# Patient Record
Sex: Female | Born: 1960 | Race: White | Hispanic: No | Marital: Married | Smoking: Current some day smoker
Health system: Southern US, Community
[De-identification: ages and names within clinical notes are randomized; demographics above are authoritative.]

## PROBLEM LIST (undated history)

## (undated) DIAGNOSIS — R5383 Other fatigue: Secondary | ICD-10-CM

## (undated) DIAGNOSIS — D649 Anemia, unspecified: Secondary | ICD-10-CM

## (undated) DIAGNOSIS — T23409A Corrosion of unspecified degree of unspecified hand, unspecified site, initial encounter: Secondary | ICD-10-CM

## (undated) DIAGNOSIS — F329 Major depressive disorder, single episode, unspecified: Secondary | ICD-10-CM

## (undated) DIAGNOSIS — E785 Hyperlipidemia, unspecified: Secondary | ICD-10-CM

## (undated) DIAGNOSIS — F32A Depression, unspecified: Secondary | ICD-10-CM

## (undated) DIAGNOSIS — F419 Anxiety disorder, unspecified: Secondary | ICD-10-CM

## (undated) DIAGNOSIS — J45909 Unspecified asthma, uncomplicated: Secondary | ICD-10-CM

## (undated) DIAGNOSIS — C189 Malignant neoplasm of colon, unspecified: Secondary | ICD-10-CM

## (undated) DIAGNOSIS — K635 Polyp of colon: Secondary | ICD-10-CM

## (undated) HISTORY — DX: Anemia, unspecified: D64.9

## (undated) HISTORY — DX: Major depressive disorder, single episode, unspecified: F32.9

## (undated) HISTORY — DX: Depression, unspecified: F32.A

## (undated) HISTORY — DX: Malignant neoplasm of colon, unspecified: C18.9

## (undated) HISTORY — PX: TONSILLECTOMY: SUR1361

---

## 2013-12-12 DIAGNOSIS — T23409A Corrosion of unspecified degree of unspecified hand, unspecified site, initial encounter: Secondary | ICD-10-CM

## 2013-12-12 HISTORY — DX: Corrosion of unspecified degree of unspecified hand, unspecified site, initial encounter: T23.409A

## 2015-12-13 DIAGNOSIS — K635 Polyp of colon: Secondary | ICD-10-CM

## 2015-12-13 DIAGNOSIS — C189 Malignant neoplasm of colon, unspecified: Secondary | ICD-10-CM

## 2015-12-13 HISTORY — PX: OTHER SURGICAL HISTORY: SHX169

## 2015-12-13 HISTORY — DX: Malignant neoplasm of colon, unspecified: C18.9

## 2015-12-13 HISTORY — DX: Polyp of colon: K63.5

## 2016-08-31 ENCOUNTER — Ambulatory Visit: Payer: BLUE CROSS/BLUE SHIELD | Attending: Gynecologic Oncology | Admitting: Gynecologic Oncology

## 2016-08-31 ENCOUNTER — Encounter: Payer: Self-pay | Admitting: Gynecologic Oncology

## 2016-08-31 VITALS — BP 114/93 | HR 73 | Temp 98.2°F | Resp 18 | Ht 62.0 in | Wt 147.7 lb

## 2016-08-31 DIAGNOSIS — F17210 Nicotine dependence, cigarettes, uncomplicated: Secondary | ICD-10-CM | POA: Diagnosis not present

## 2016-08-31 DIAGNOSIS — D259 Leiomyoma of uterus, unspecified: Secondary | ICD-10-CM | POA: Insufficient documentation

## 2016-08-31 DIAGNOSIS — D649 Anemia, unspecified: Secondary | ICD-10-CM | POA: Diagnosis not present

## 2016-08-31 DIAGNOSIS — Z720 Tobacco use: Secondary | ICD-10-CM | POA: Insufficient documentation

## 2016-08-31 DIAGNOSIS — F329 Major depressive disorder, single episode, unspecified: Secondary | ICD-10-CM | POA: Diagnosis not present

## 2016-08-31 DIAGNOSIS — C541 Malignant neoplasm of endometrium: Secondary | ICD-10-CM

## 2016-08-31 DIAGNOSIS — Z85038 Personal history of other malignant neoplasm of large intestine: Secondary | ICD-10-CM | POA: Diagnosis not present

## 2016-08-31 NOTE — Patient Instructions (Signed)
Preparing for your Surgery  Plan for surgery on September 22, 2016 at [facility] with Dr. Everitt Amber.  You will be scheduled for a robotic assisted total hysterectomy, bilateral salpingo-oophorectomy, sentinel lymph node biopsy.  We will also place a referral for you to meet with the Genetics Counselor at the [facility] after your surgery.  Pre-operative Testing -You will receive a phone call from presurgical testing at [facility] to arrange for a pre-operative testing appointment before your surgery.  This appointment normally occurs one to two weeks before your scheduled surgery.   -Bring your insurance card, copy of an advanced directive if applicable, medication list  -At that visit, you will be asked to sign a consent for a possible blood transfusion in case a transfusion becomes necessary during surgery.  The need for a blood transfusion is rare but having consent is a necessary part of your care.     -You should not be taking blood thinners or aspirin at least ten days prior to surgery unless instructed by your surgeon.  Day Before Surgery at [place] will be asked to take in a light diet the day before surgery.  Avoid carbonated beverages.  You will be advised to have nothing to eat or drink after midnight the evening before.     Eat a light diet the day before surgery.  Examples including soups, broths, toast, yogurt, mashed potatoes.  Things to avoid include carbonated beverages (fizzy beverages), raw fruits and raw vegetables, or beans.    If your bowels are filled with gas, your surgeon will have difficulty visualizing your pelvic organs which increases your surgical risks.  Your role in recovery Your role is to become active as soon as directed by your doctor, while still giving yourself time to heal.  Rest when you feel tired. You will be asked to do the following in order to speed your recovery:  - Cough and breathe deeply. This helps  toclear and expand your lungs and can prevent pneumonia. You may be given a spirometer to practice deep breathing. A staff member will show you how to use the spirometer. - Do mild physical activity. Walking or moving your legs help your circulation and body functions return to normal. A staff member will help you when you try to walk and will provide you with simple exercises. Do not try to get up or walk alone the first time. - Actively manage your pain. Managing your pain lets you move in comfort. We will ask you to rate your pain on a scale of zero to 10. It is your responsibility to tell your doctor or nurse where and how much you hurt so your pain can be treated.  Special Considerations -If you are diabetic, you may be placed on insulin after surgery to have closer control over your blood sugars to promote healing and recovery.  This does not mean that you will be discharged on insulin.  If applicable, your oral antidiabetics will be resumed when you are tolerating a solid diet.  -Your final pathology results from surgery should be available by the Friday after surgery and the results will be relayed to you when available.   Blood Transfusion Information WHAT IS A BLOOD TRANSFUSION? A transfusion is the replacement of blood or some of its parts. Blood is made up of multiple cells which provide different functions.  Red blood cells carry oxygen and are used for blood loss replacement.  White blood cells fight against  infection.  Platelets control bleeding.  Plasma helps clot blood.  Other blood products are available for specialized needs, such as hemophilia or other clotting disorders. BEFORE THE TRANSFUSION  Who gives blood for transfusions?   You may be able to donate blood to be used at a later date on yourself (autologous donation).  Relatives can be asked to donate blood. This is generally not any safer than if you have received blood from a stranger. The same precautions are  taken to ensure safety when a relative's blood is donated.  Healthy volunteers who are fully evaluated to make sure their blood is safe. This is blood bank blood. Transfusion therapy is the safest it has ever been in the practice of medicine. Before blood is taken from a donor, a complete history is taken to make sure that person has no history of diseases nor engages in risky social behavior (examples are intravenous drug use or sexual activity with multiple partners). The donor's travel history is screened to minimize risk of transmitting infections, such as malaria. The donated blood is tested for signs of infectious diseases, such as HIV and hepatitis. The blood is then tested to be sure it is compatible with you in order to minimize the chance of a transfusion reaction. If you or a relative donates blood, this is often done in anticipation of surgery and is not appropriate for emergency situations. It takes many days to process the donated blood. RISKS AND COMPLICATIONS Although transfusion therapy is very safe and saves many lives, the main dangers of transfusion include:   Getting an infectious disease.  Developing a transfusion reaction. This is an allergic reaction to something in the blood you were given. Every precaution is taken to prevent this. The decision to have a blood transfusion has been considered carefully by your caregiver before blood is given. Blood is not given unless the benefits outweigh the risks.

## 2016-08-31 NOTE — Addendum Note (Signed)
Addended by: Joylene John D on: 08/31/2016 01:24 PM   Modules accepted: Orders

## 2016-08-31 NOTE — Progress Notes (Signed)
Consult Note: Gyn-Onc  Consult was requested by Dr. Rutha Bouchard for the evaluation of Stephanie Mclean 55 y.o. female  CC:  Chief Complaint  Patient presents with  . Endometrial cancer    New Consultation    Assessment/Plan:  Ms. Stephanie Mclean  is a 62 y.o.  year old with grade 1 endometrioid endometrial cancer. She has a personal history of an early stage colon cancer removed colonoscopically earlier this year.   A detailed discussion was held with the patient and her family with regard to to her endometrial cancer diagnosis. We discussed the standard management options for uterine cancer which includes surgery followed possibly by adjuvant therapy depending on the results of surgery. The options for surgical management include a hysterectomy and removal of the tubes and ovaries possibly with removal of pelvic and para-aortic lymph nodes. A minimally invasive approach including a robotic hysterectomy or laparoscopic hysterectomy have benefits including shorter hospital stay, recovery time and better wound healing. The alternative approach is an open hysterectomy. The patient has been counseled about these surgical options and the risks of surgery in general including infection, bleeding, damage to surrounding structures (including bowel, bladder, ureters, nerves or vessels), and the postoperative risks of PE/ DVT, and lymphedema. I extensively reviewed the additional risks of robotic hysterectomy including possible need for conversion to open laparotomy.  I discussed positioning during surgery of trendelenberg and risks of minor facial swelling and care we take in preoperative positioning.  After counseling and consideration of her options, she desires to proceed with robotic asssisted total hysterectomy, BSO, SLN biopsy.  I discussed that she should attempt to cut back or quit smoking preoperatively to decrease risk of perioperative complications and optimize wound healing.  She will be seen by  anesthesia for preoperative clearance and discussion of postoperative pain management.  She was given the opportunity to ask questions, which were answered to her satisfaction, and she is agreement with the above mentioned plan of care.  She will be referred to genetic counselors to discuss Lynch syndrome testing given her young age at diagnosis (<60), lack of comorbidities, and metachronous colon cancer diagnosis. She otherwise does not have a family history suggestive of lynch syndrome   HPI: Stephanie Mclean is a 3 year old G2P2 who is seen in consultation at the request of Dr Rutha Bouchard for endometrial cancer (grade 1).  The patient has a history of abnormal uterine bleeding for approximately 5 years. She reported having benign endometrial sampling in approximately 2014. She has known uterine fibroids and an ultrasound of the pelvis in December 2016 showed the largest measuring 2.2cm, and an 24mm endometrial thickness.   Endometrial sampling with pipelle on 08/16/16 showed FIGO grade 1 endometrial adenocarcinoma.   In January 2017 she was diagnosed with a preinvasive vs early stage colon cancer that was completely resected colonoscopically.  She has no first or second degree relatives with Lynch-associated malignancies.  Current Meds:  Outpatient Encounter Prescriptions as of 08/31/2016  Medication Sig  . ezetimibe (ZETIA) 10 MG tablet Take 10 mg by mouth daily.  . Fe Fum-FA-B Cmp-C-Zn-Mg-Mn-Cu (FERROCITE PLUS) 106-1 MG TABS Take 1 tablet by mouth 2 (two) times daily.  Marland Kitchen gemfibrozil (LOPID) 600 MG tablet Take 600 mg by mouth 2 (two) times daily before a meal.  . LORazepam (ATIVAN) 0.5 MG tablet Take 0.5 mg by mouth every 8 (eight) hours as needed for anxiety.  Marland Kitchen omeprazole (PRILOSEC) 40 MG capsule Take 40 mg by mouth daily.  . sertraline (  ZOLOFT) 100 MG tablet Take 100 mg by mouth daily.   No facility-administered encounter medications on file as of 08/31/2016.     Allergy: Not on  File  Social Hx:   Social History   Social History  . Marital status: Married    Spouse name: N/A  . Number of children: N/A  . Years of education: N/A   Occupational History  . Not on file.   Social History Main Topics  . Smoking status: Current Some Day Smoker    Packs/day: 0.50    Years: 30.00    Types: Cigarettes  . Smokeless tobacco: Never Used  . Alcohol use Yes     Comment: occ  . Drug use: No  . Sexual activity: Yes   Other Topics Concern  . Not on file   Social History Narrative  . No narrative on file    Past Surgical Hx:  Past Surgical History:  Procedure Laterality Date  . TONSILLECTOMY     at age 39 years old    Past Medical Hx:  Past Medical History:  Diagnosis Date  . Anemia   . Colon cancer ([place]) 12/2015  . Depression     Past Gynecological History:  Abnormal uterine bleeding, SVD x 2 No LMP recorded.  Family Hx:  Family History  Problem Relation Age of Onset  . Diabetes Mother   . Stroke Father   . Cancer Paternal Uncle     Review of Systems:  Constitutional  Feels well,    ENT Normal appearing ears and nares bilaterally Skin/Breast  No rash, sores, jaundice, itching, dryness Cardiovascular  No chest pain, shortness of breath, or edema  Pulmonary  No cough or wheeze.  Gastro Intestinal  No nausea, vomitting, or diarrhoea. No bright red blood per rectum, no abdominal pain, change in bowel movement, or constipation.  Genito Urinary  No frequency, urgency, dysuria, + abnormal bleeding Musculo Skeletal  No myalgia, arthralgia, joint swelling or pain  Neurologic  No weakness, numbness, change in gait,  Psychology  No depression, anxiety, insomnia.   Vitals:  Blood pressure (!) 114/93, pulse 73, temperature 98.2 F (36.8 C), temperature source Oral, resp. rate 18, height 5\' 2"  (1.575 m), weight 147 lb 11.2 oz (67 kg), SpO2 96 %.  Physical Exam: WD in NAD Neck  Supple NROM, without any enlargements.  Lymph Node  Survey No cervical supraclavicular or inguinal adenopathy Cardiovascular  Pulse normal rate, regularity and rhythm. S1 and S2 normal.  Lungs  Clear to auscultation bilateraly, without wheezes/crackles/rhonchi. Good air movement.  Skin  No rash/lesions/breakdown  Psychiatry  Alert and oriented to person, place, and time  Abdomen  Normoactive bowel sounds, abdomen soft, non-tender and nonobese without evidence of hernia.  Back No CVA tenderness Genito Urinary  Vulva/vagina: Normal external female genitalia.  No lesions. No discharge or bleeding.  Bladder/urethra:  No lesions or masses, well supported bladder  Vagina: normal  Cervix: Normal appearing, no lesions.  Uterus:  Slightly bulky mobile, no parametrial involvement or nodularity.  Adnexa: nor masses. Rectal  Deferred.  Extremities  No bilateral cyanosis, clubbing or edema.   Donaciano Eva, MD  08/31/2016, 10:59 AM

## 2016-09-01 NOTE — Progress Notes (Signed)
Coming for pre op- please place orders in epic  thanks

## 2016-09-19 NOTE — Patient Instructions (Addendum)
Stephanie Mclean  09/19/2016   Your procedure is scheduled on: 09-29-16  Report to [facility] Main  Entrance take [facility]  elevators to 3rd floor to  [place] at 530  AM.  Call this number if you have problems the morning of surgery [phone]   Remember: ONLY 1 PERSON MAY GO WITH YOU TO SHORT STAY TO GET  READY MORNING OF [name].  Do not eat food or drink liquids :After Midnight NIGHT BEFORE SURGEYR, LIGHT DIET DAY BEFORE SURGERY SEE INSTRUCTIONS BELOW.   Take these medicines the morning of surgery with A SIP OF WATER: LORAZEPAM (ATIVAN ) IF NEEDED SERTRALINE (ZOLOFT) OMEPRAZOLE (ZOLOFT)                               You may not have any metal on your body including hair pins and              piercings  Do not wear jewelry, make-up, lotions, powders or perfumes, deodorant             Do not wear nail polish.  Do not shave  48 hours prior to surgery.              Men may shave face and neck.   Do not bring valuables to the hospital. [place].  Contacts, dentures or bridgework may not be worn into surgery.  Leave suitcase in the car. After surgery it may be brought to your room.                 Please read over the following fact sheets you were given: _____________________________________________________________________             Eat a light diet the day before surgery.  Examples including soups, broths, toast, yogurt, mashed potatoes.  Things to avoid include carbonated beverages (fizzy beverages), raw fruits and raw vegetables, or beans.   If your bowels are filled with gas, your surgeon will have difficulty visualizing your pelvic organs which increases your surgical risks.Stephanie Mclean - Preparing for Surgery Before surgery, you can play an important role.  Because skin is not sterile, your skin needs to be as free of germs as possible.  You can reduce the number of germs on your skin by washing  with CHG (chlorahexidine gluconate) soap before surgery.  CHG is an antiseptic cleaner which kills germs and bonds with the skin to continue killing germs even after washing. Please DO NOT use if you have an allergy to CHG or antibacterial soaps.  If your skin becomes reddened/irritated stop using the CHG and inform your nurse when you arrive at Short Stay. Do not shave (including legs and underarms) for at least 48 hours prior to the first CHG shower.  You may shave your face/neck. Please follow these instructions carefully:  1.  Shower with CHG Soap the night before surgery and the  morning of Surgery.  2.  If you choose to wash your hair, wash your hair first as usual with your  normal  shampoo.  3.  After you shampoo, rinse your hair and body thoroughly to remove the  shampoo.  4.  Use CHG as you would any other liquid soap.  You can apply chg directly  to the skin and wash                       Gently with a scrungie or clean washcloth.  5.  Apply the CHG Soap to your body ONLY FROM THE NECK DOWN.   Do not use on face/ open                           Wound or open sores. Avoid contact with eyes, ears mouth and genitals (private parts).                       Wash face,  Genitals (private parts) with your normal soap.             6.  Wash thoroughly, paying special attention to the area where your surgery  will be performed.  7.  Thoroughly rinse your body with warm water from the neck down.  8.  DO NOT shower/wash with your normal soap after using and rinsing off  the CHG Soap.                9.  Pat yourself dry with a clean towel.            10.  Wear clean pajamas.            11.  Place clean sheets on your bed the night of your first shower and do not  sleep with pets. Day of Surgery : Do not apply any lotions/deodorants the morning of surgery.  Please wear clean clothes to the hospital/surgery center.  FAILURE TO FOLLOW THESE INSTRUCTIONS MAY RESULT IN THE  CANCELLATION OF YOUR SURGERY PATIENT SIGNATURE_________________________________  NURSE SIGNATURE__________________________________  ________________________________________________________________________   Stephanie Mclean  An incentive spirometer is a tool that can help keep your lungs clear and active. This tool measures how well you are filling your lungs with each breath. Taking long deep breaths may help reverse or decrease the chance of developing breathing (pulmonary) problems (especially infection) following:  A long period of time when you are unable to move or be active. BEFORE THE PROCEDURE   If the spirometer includes an indicator to show your best effort, your nurse or respiratory therapist will set it to a desired goal.  If possible, sit up straight or lean slightly forward. Try not to slouch.  Hold the incentive spirometer in an upright position. INSTRUCTIONS FOR USE  1. Sit on the edge of your bed if possible, or sit up as far as you can in bed or on a chair. 2. Hold the incentive spirometer in an upright position. 3. Breathe out normally. 4. Place the mouthpiece in your mouth and seal your lips tightly around it. 5. Breathe in slowly and as deeply as possible, raising the piston or the ball toward the top of the column. 6. Hold your breath for 3-5 seconds or for as long as possible. Allow the piston or ball to fall to the bottom of the column. 7. Remove the mouthpiece from your mouth and breathe out normally. 8. Rest for a few seconds and repeat Steps 1 through 7 at least 10 times every 1-2 hours when you are awake. Take your time and take a few normal breaths between deep breaths. 9. The spirometer may include an indicator to show  your best effort. Use the indicator as a goal to work toward during each repetition. 10. After each set of 10 deep breaths, practice coughing to be sure your lungs are clear. If you have an incision (the cut made at the time of surgery),  support your incision when coughing by placing a pillow or rolled up towels firmly against it. Once you are able to get out of bed, walk around indoors and cough well. You may stop using the incentive spirometer when instructed by your caregiver.  RISKS AND COMPLICATIONS  Take your time so you do not get dizzy or light-headed.  If you are in pain, you may need to take or ask for pain medication before doing incentive spirometry. It is harder to take a deep breath if you are having pain. AFTER USE  Rest and breathe slowly and easily.  It can be helpful to keep track of a log of your progress. Your caregiver can provide you with a simple table to help with this. If you are using the spirometer at home, follow these instructions: [place] IF:   You are having difficultly using the spirometer.  You have trouble using the spirometer as often as instructed.  Your pain medication is not giving enough relief while using the spirometer.  You develop fever of 100.5 F (38.1 C) or higher. SEEK IMMEDIATE MEDICAL CARE IF:   You cough up bloody sputum that had not been present before.  You develop fever of 102 F (38.9 C) or greater.  You develop worsening pain at or near the incision site. MAKE SURE YOU:   Understand these instructions.  Will watch your condition.  Will get help right away if you are not doing well or get worse. Document Released: 04/10/2007 Document Revised: 02/20/2012 Document Reviewed: 06/11/2007 Exit[employer] Patient Information 2014 [employer], [place].   ________________________________________________________________________  WHAT IS A BLOOD TRANSFUSION? Blood Transfusion Information  A transfusion is the replacement of blood or some of its parts. Blood is made up of multiple cells which provide different functions.  Red blood cells carry oxygen and are used for blood loss replacement.  White blood cells fight against infection.  Platelets control  bleeding.  Plasma helps clot blood.  Other blood products are available for specialized needs, such as hemophilia or other clotting disorders. BEFORE THE TRANSFUSION  Who gives blood for transfusions?   Healthy volunteers who are fully evaluated to make sure their blood is safe. This is blood bank blood. Transfusion therapy is the safest it has ever been in the practice of medicine. Before blood is taken from a donor, a complete history is taken to make sure that person has no history of diseases nor engages in risky social behavior (examples are intravenous drug use or sexual activity with multiple partners). The donor's travel history is screened to minimize risk of transmitting infections, such as malaria. The donated blood is tested for signs of infectious diseases, such as HIV and hepatitis. The blood is then tested to be sure it is compatible with you in order to minimize the chance of a transfusion reaction. If you or a relative donates blood, this is often done in anticipation of surgery and is not appropriate for emergency situations. It takes many days to process the donated blood. RISKS AND COMPLICATIONS Although transfusion therapy is very safe and saves many lives, the main dangers of transfusion include:   Getting an infectious disease.  Developing a transfusion reaction. This is an allergic reaction to  something in the blood you were given. Every precaution is taken to prevent this. The decision to have a blood transfusion has been considered carefully by your caregiver before blood is given. Blood is not given unless the benefits outweigh the risks. AFTER THE TRANSFUSION  Right after receiving a blood transfusion, you will usually feel much better and more energetic. This is especially true if your red blood cells have gotten low (anemic). The transfusion raises the level of the red blood cells which carry oxygen, and this usually causes an energy increase.  The nurse  administering the transfusion will monitor you carefully for complications. HOME CARE INSTRUCTIONS  No special instructions are needed after a transfusion. You may find your energy is better. Speak with your caregiver about any limitations on activity for underlying diseases you may have. SEEK MEDICAL CARE IF:   Your condition is not improving after your transfusion.  You develop redness or irritation at the intravenous (IV) site. SEEK IMMEDIATE MEDICAL CARE IF:  Any of the following symptoms occur over the next 12 hours:  Shaking chills.  You have a temperature by mouth above 102 F (38.9 C), not controlled by medicine.  Chest, back, or muscle pain.  People around you feel you are not acting correctly or are confused.  Shortness of breath or difficulty breathing.  Dizziness and fainting.  You get a rash or develop hives.  You have a decrease in urine output.  Your urine turns a dark color or changes to pink, red, or brown. Any of the following symptoms occur over the next 10 days:  You have a temperature by mouth above 102 F (38.9 C), not controlled by medicine.  Shortness of breath.  Weakness after normal activity.  The white part of the eye turns yellow (jaundice).  You have a decrease in the amount of urine or are urinating less often.  Your urine turns a dark color or changes to pink, red, or brown. Document Released: 11/25/2000 Document Revised: 02/20/2012 Document Reviewed: 07/14/2008 [facility] Patient Information 2014 [place], [place].  _______________________________________________________________________

## 2016-09-20 ENCOUNTER — Encounter (HOSPITAL_COMMUNITY): Payer: Self-pay | Admitting: *Deleted

## 2016-09-20 ENCOUNTER — Ambulatory Visit (HOSPITAL_COMMUNITY)
Admission: RE | Admit: 2016-09-20 | Discharge: 2016-09-20 | Disposition: A | Discharge status: Home / Self Care | Payer: BLUE CROSS/BLUE SHIELD | Source: Ambulatory Visit | Attending: Gynecologic Oncology | Admitting: Gynecologic Oncology

## 2016-09-20 ENCOUNTER — Encounter (HOSPITAL_COMMUNITY)
Admission: RE | Admit: 2016-09-20 | Discharge: 2016-09-20 | Disposition: A | Discharge status: Home / Self Care | Payer: BLUE CROSS/BLUE SHIELD | Source: Ambulatory Visit | Attending: Gynecologic Oncology | Admitting: Gynecologic Oncology

## 2016-09-20 DIAGNOSIS — Z01812 Encounter for preprocedural laboratory examination: Secondary | ICD-10-CM | POA: Diagnosis not present

## 2016-09-20 DIAGNOSIS — Z01810 Encounter for preprocedural cardiovascular examination: Secondary | ICD-10-CM | POA: Insufficient documentation

## 2016-09-20 DIAGNOSIS — C541 Malignant neoplasm of endometrium: Secondary | ICD-10-CM

## 2016-09-20 DIAGNOSIS — J45909 Unspecified asthma, uncomplicated: Secondary | ICD-10-CM

## 2016-09-20 HISTORY — DX: Unspecified asthma, uncomplicated: J45.909

## 2016-09-20 HISTORY — DX: Polyp of colon: K63.5

## 2016-09-20 HISTORY — DX: Anxiety disorder, unspecified: F41.9

## 2016-09-20 HISTORY — DX: Other fatigue: R53.83

## 2016-09-20 HISTORY — DX: Hyperlipidemia, unspecified: E78.5

## 2016-09-20 HISTORY — DX: Corrosion of unspecified degree of unspecified hand, unspecified site, initial encounter: T23.409A

## 2016-09-20 LAB — CBC WITH DIFFERENTIAL/PLATELET
BASOS PCT: 1 %
Basophils Absolute: 0 10*3/uL (ref 0.0–0.1)
EOS ABS: 0.1 10*3/uL (ref 0.0–0.7)
EOS PCT: 2 %
HCT: 43.6 % (ref 36.0–46.0)
HEMOGLOBIN: 15.1 g/dL — AB (ref 12.0–15.0)
LYMPHS ABS: 1.1 10*3/uL (ref 0.7–4.0)
Lymphocytes Relative: 30 %
MCH: 32.9 pg (ref 26.0–34.0)
MCHC: 34.6 g/dL (ref 30.0–36.0)
MCV: 95 fL (ref 78.0–100.0)
MONO ABS: 0.4 10*3/uL (ref 0.1–1.0)
MONOS PCT: 10 %
NEUTROS PCT: 57 %
Neutro Abs: 1.9 10*3/uL (ref 1.7–7.7)
Platelets: 258 10*3/uL (ref 150–400)
RBC: 4.59 MIL/uL (ref 3.87–5.11)
RDW: 12.4 % (ref 11.5–15.5)
WBC: 3.5 10*3/uL — ABNORMAL LOW (ref 4.0–10.5)

## 2016-09-20 LAB — ABO/RH: ABO/RH(D): O POS

## 2016-09-20 LAB — COMPREHENSIVE METABOLIC PANEL
ALK PHOS: 47 U/L (ref 38–126)
ALT: 10 U/L — ABNORMAL LOW (ref 14–54)
ANION GAP: 7 (ref 5–15)
AST: 17 U/L (ref 15–41)
Albumin: 4.8 g/dL (ref 3.5–5.0)
BILIRUBIN TOTAL: 0.8 mg/dL (ref 0.3–1.2)
BUN: 19 mg/dL (ref 6–20)
CALCIUM: 9.5 mg/dL (ref 8.9–10.3)
CO2: 24 mmol/L (ref 22–32)
Chloride: 108 mmol/L (ref 101–111)
Creatinine, Ser: 0.51 mg/dL (ref 0.44–1.00)
GFR calc non Af Amer: >60 mL/min (ref >60)
Glucose, Bld: 89 mg/dL (ref 65–99)
POTASSIUM: 4.3 mmol/L (ref 3.5–5.1)
SODIUM: 139 mmol/L (ref 135–145)
TOTAL PROTEIN: 7.6 g/dL (ref 6.5–8.1)

## 2016-09-20 LAB — URINALYSIS, ROUTINE W REFLEX MICROSCOPIC
Bilirubin Urine: NEGATIVE
Glucose, UA: NEGATIVE mg/dL
Hgb urine dipstick: NEGATIVE
KETONES UR: NEGATIVE mg/dL
LEUKOCYTES UA: NEGATIVE
NITRITE: NEGATIVE
PROTEIN: NEGATIVE mg/dL
Specific Gravity, Urine: 1.022 (ref 1.005–1.030)
pH: 6 (ref 5.0–8.0)

## 2016-09-20 NOTE — Progress Notes (Signed)
CHEST XRAY [facility] 12-24-15 ON CHART

## 2016-09-20 NOTE — Progress Notes (Signed)
Spoke with dr germeroth enesthesia and no serum pregnancy test required for patient and patient spouse has had vasectomy

## 2016-09-29 ENCOUNTER — Encounter (HOSPITAL_COMMUNITY)
Admission: RE | Disposition: A | Discharge status: Home / Self Care | Payer: Self-pay | Source: Ambulatory Visit | Attending: Gynecologic Oncology

## 2016-09-29 ENCOUNTER — Ambulatory Visit (HOSPITAL_COMMUNITY): Payer: BLUE CROSS/BLUE SHIELD | Admitting: Anesthesiology

## 2016-09-29 ENCOUNTER — Encounter (HOSPITAL_COMMUNITY): Payer: Self-pay | Admitting: *Deleted

## 2016-09-29 ENCOUNTER — Ambulatory Visit (HOSPITAL_COMMUNITY)
Admission: RE | Admit: 2016-09-29 | Discharge: 2016-09-30 | Disposition: A | Discharge status: Home / Self Care | Payer: BLUE CROSS/BLUE SHIELD | Source: Ambulatory Visit | Attending: Gynecologic Oncology | Admitting: Gynecologic Oncology

## 2016-09-29 DIAGNOSIS — F17210 Nicotine dependence, cigarettes, uncomplicated: Secondary | ICD-10-CM | POA: Diagnosis not present

## 2016-09-29 DIAGNOSIS — C541 Malignant neoplasm of endometrium: Secondary | ICD-10-CM | POA: Diagnosis not present

## 2016-09-29 DIAGNOSIS — D251 Intramural leiomyoma of uterus: Secondary | ICD-10-CM | POA: Insufficient documentation

## 2016-09-29 HISTORY — PX: ROBOTIC ASSISTED TOTAL HYSTERECTOMY WITH BILATERAL SALPINGO OOPHERECTOMY: SHX6086

## 2016-09-29 HISTORY — PX: LYMPH NODE BIOPSY: SHX201

## 2016-09-29 LAB — TYPE AND SCREEN
ABO/RH(D): O POS
Antibody Screen: NEGATIVE

## 2016-09-29 SURGERY — HYSTERECTOMY, TOTAL, ROBOT-ASSISTED, LAPAROSCOPIC, WITH BILATERAL SALPINGO-OOPHORECTOMY
Anesthesia: General

## 2016-09-29 MED ORDER — GABAPENTIN 300 MG PO CAPS
600.0000 mg | ORAL_CAPSULE | Freq: Every day | ORAL | Status: AC
  Administered 2016-09-29: 600 mg via ORAL
  Filled 2016-09-29: qty 2

## 2016-09-29 MED ORDER — HYDROMORPHONE HCL 2 MG/ML IJ SOLN
INTRAMUSCULAR | Status: AC
  Filled 2016-09-29: qty 1

## 2016-09-29 MED ORDER — DEXMEDETOMIDINE HCL IN NACL 400 MCG/100ML IV SOLN
INTRAVENOUS | Status: DC | PRN
  Administered 2016-09-29: 0.7 ug/kg/h via INTRAVENOUS

## 2016-09-29 MED ORDER — ROCURONIUM BROMIDE 10 MG/ML (PF) SYRINGE
PREFILLED_SYRINGE | INTRAVENOUS | Status: AC
  Filled 2016-09-29: qty 10

## 2016-09-29 MED ORDER — DEXAMETHASONE SODIUM PHOSPHATE 10 MG/ML IJ SOLN
INTRAMUSCULAR | Status: AC
  Filled 2016-09-29: qty 1

## 2016-09-29 MED ORDER — CEFAZOLIN SODIUM-DEXTROSE 2-4 GM/100ML-% IV SOLN
INTRAVENOUS | Status: AC
  Filled 2016-09-29: qty 100

## 2016-09-29 MED ORDER — KETOROLAC TROMETHAMINE 15 MG/ML IJ SOLN
15.0000 mg | Freq: Four times a day (QID) | INTRAMUSCULAR | Status: DC
  Administered 2016-09-29 – 2016-09-30 (×3): 15 mg via INTRAVENOUS
  Filled 2016-09-29 (×3): qty 1

## 2016-09-29 MED ORDER — ROCURONIUM BROMIDE 100 MG/10ML IV SOLN
INTRAVENOUS | Status: DC | PRN
  Administered 2016-09-29 (×4): 10 mg via INTRAVENOUS
  Administered 2016-09-29: 50 mg via INTRAVENOUS
  Administered 2016-09-29: 10 mg via INTRAVENOUS

## 2016-09-29 MED ORDER — PROMETHAZINE HCL 25 MG/ML IJ SOLN: 6.2500 mg | INTRAMUSCULAR | Status: DC | PRN

## 2016-09-29 MED ORDER — LACTATED RINGERS IV SOLN
INTRAVENOUS | Status: DC | PRN
  Administered 2016-09-29 (×2): via INTRAVENOUS

## 2016-09-29 MED ORDER — HYDROMORPHONE HCL 1 MG/ML IJ SOLN
0.2500 mg | INTRAMUSCULAR | Status: DC | PRN
  Administered 2016-09-29: 0.5 mg via INTRAVENOUS

## 2016-09-29 MED ORDER — OXYCODONE-ACETAMINOPHEN 5-325 MG PO TABS
1.0000 | ORAL_TABLET | ORAL | Status: DC | PRN
  Administered 2016-09-29 – 2016-09-30 (×2): 2 via ORAL
  Filled 2016-09-29 (×2): qty 2

## 2016-09-29 MED ORDER — STERILE WATER FOR IRRIGATION IR SOLN
Status: DC | PRN
  Administered 2016-09-29: 1000 mL

## 2016-09-29 MED ORDER — LORAZEPAM 0.5 MG PO TABS: 0.5000 mg | ORAL_TABLET | Freq: Three times a day (TID) | ORAL | Status: DC | PRN

## 2016-09-29 MED ORDER — SUGAMMADEX SODIUM 200 MG/2ML IV SOLN
INTRAVENOUS | Status: AC
  Filled 2016-09-29: qty 2

## 2016-09-29 MED ORDER — PANTOPRAZOLE SODIUM 40 MG PO TBEC
80.0000 mg | DELAYED_RELEASE_TABLET | Freq: Every day | ORAL | Status: DC
  Administered 2016-09-30: 80 mg via ORAL
  Filled 2016-09-29: qty 2

## 2016-09-29 MED ORDER — ONDANSETRON HCL 4 MG/2ML IJ SOLN: 4.0000 mg | Freq: Four times a day (QID) | INTRAMUSCULAR | Status: DC | PRN

## 2016-09-29 MED ORDER — STERILE WATER FOR INJECTION IJ SOLN
INTRAMUSCULAR | Status: AC
  Filled 2016-09-29: qty 10

## 2016-09-29 MED ORDER — LACTATED RINGERS IR SOLN
Status: DC | PRN
  Administered 2016-09-29: 1000 mL

## 2016-09-29 MED ORDER — PROPOFOL 10 MG/ML IV BOLUS
INTRAVENOUS | Status: AC
  Filled 2016-09-29: qty 40

## 2016-09-29 MED ORDER — EZETIMIBE 10 MG PO TABS: 10.0000 mg | ORAL_TABLET | Freq: Every day | ORAL | Status: DC

## 2016-09-29 MED ORDER — HYDROMORPHONE HCL 1 MG/ML IJ SOLN
0.2000 mg | INTRAMUSCULAR | Status: DC | PRN
  Administered 2016-09-29: 0.5 mg via INTRAVENOUS
  Filled 2016-09-29: qty 1

## 2016-09-29 MED ORDER — LIDOCAINE HCL (CARDIAC) 20 MG/ML IV SOLN
INTRAVENOUS | Status: DC | PRN
  Administered 2016-09-29: 50 mg via INTRAVENOUS

## 2016-09-29 MED ORDER — DEXMEDETOMIDINE HCL IN NACL 200 MCG/50ML IV SOLN
INTRAVENOUS | Status: AC
  Filled 2016-09-29: qty 50

## 2016-09-29 MED ORDER — CEFAZOLIN SODIUM-DEXTROSE 2-4 GM/100ML-% IV SOLN
2.0000 g | INTRAVENOUS | Status: AC
  Administered 2016-09-29: 2 g via INTRAVENOUS

## 2016-09-29 MED ORDER — HYDROMORPHONE HCL 1 MG/ML IJ SOLN
INTRAMUSCULAR | Status: DC | PRN
  Administered 2016-09-29 (×4): 0.5 mg via INTRAVENOUS

## 2016-09-29 MED ORDER — ONDANSETRON HCL 4 MG/2ML IJ SOLN
INTRAMUSCULAR | Status: AC
  Filled 2016-09-29: qty 2

## 2016-09-29 MED ORDER — PROPOFOL 10 MG/ML IV BOLUS
INTRAVENOUS | Status: DC | PRN
  Administered 2016-09-29: 170 mg via INTRAVENOUS

## 2016-09-29 MED ORDER — ROCURONIUM BROMIDE 10 MG/ML (PF) SYRINGE
PREFILLED_SYRINGE | INTRAVENOUS | Status: AC
Start: 2016-09-29 — End: 2016-09-29
  Filled 2016-09-29: qty 10

## 2016-09-29 MED ORDER — ONDANSETRON HCL 4 MG PO TABS: 4.0000 mg | ORAL_TABLET | Freq: Four times a day (QID) | ORAL | Status: DC | PRN

## 2016-09-29 MED ORDER — ONDANSETRON HCL 4 MG/2ML IJ SOLN
INTRAMUSCULAR | Status: DC | PRN
  Administered 2016-09-29: 4 mg via INTRAVENOUS

## 2016-09-29 MED ORDER — GEMFIBROZIL 600 MG PO TABS
600.0000 mg | ORAL_TABLET | Freq: Two times a day (BID) | ORAL | Status: DC
  Administered 2016-09-29 – 2016-09-30 (×2): 600 mg via ORAL
  Filled 2016-09-29 (×2): qty 1

## 2016-09-29 MED ORDER — ENOXAPARIN SODIUM 40 MG/0.4ML ~~LOC~~ SOLN
40.0000 mg | SUBCUTANEOUS | Status: AC
  Administered 2016-09-29: 40 mg via SUBCUTANEOUS
  Filled 2016-09-29: qty 0.4

## 2016-09-29 MED ORDER — EPHEDRINE SULFATE 50 MG/ML IJ SOLN
INTRAMUSCULAR | Status: DC | PRN
  Administered 2016-09-29: 5 mg via INTRAVENOUS
  Administered 2016-09-29: 10 mg via INTRAVENOUS
  Administered 2016-09-29: 5 mg via INTRAVENOUS

## 2016-09-29 MED ORDER — SUGAMMADEX SODIUM 200 MG/2ML IV SOLN
INTRAVENOUS | Status: DC | PRN
  Administered 2016-09-29: 200 mg via INTRAVENOUS

## 2016-09-29 MED ORDER — LIDOCAINE 2% (20 MG/ML) 5 ML SYRINGE
INTRAMUSCULAR | Status: AC
  Filled 2016-09-29: qty 5

## 2016-09-29 MED ORDER — KCL IN DEXTROSE-NACL 20-5-0.45 MEQ/L-%-% IV SOLN
INTRAVENOUS | Status: DC
  Administered 2016-09-29: 16:00:00 via INTRAVENOUS
  Filled 2016-09-29 (×2): qty 1000

## 2016-09-29 MED ORDER — HYDROMORPHONE HCL 1 MG/ML IJ SOLN
INTRAMUSCULAR | Status: AC
  Filled 2016-09-29: qty 1

## 2016-09-29 MED ORDER — MIDAZOLAM HCL 5 MG/5ML IJ SOLN
INTRAMUSCULAR | Status: DC | PRN
  Administered 2016-09-29: 2 mg via INTRAVENOUS

## 2016-09-29 MED ORDER — FENTANYL CITRATE (PF) 100 MCG/2ML IJ SOLN
INTRAMUSCULAR | Status: DC | PRN
  Administered 2016-09-29: 50 ug via INTRAVENOUS
  Administered 2016-09-29: 100 ug via INTRAVENOUS
  Administered 2016-09-29 (×2): 50 ug via INTRAVENOUS

## 2016-09-29 MED ORDER — GLYCOPYRROLATE 0.2 MG/ML IV SOSY
PREFILLED_SYRINGE | INTRAVENOUS | Status: AC
  Filled 2016-09-29: qty 3

## 2016-09-29 MED ORDER — SODIUM CHLORIDE 0.9 % IJ SOLN
INTRAMUSCULAR | Status: AC
  Filled 2016-09-29: qty 10

## 2016-09-29 MED ORDER — MIDAZOLAM HCL 2 MG/2ML IJ SOLN
INTRAMUSCULAR | Status: AC
  Filled 2016-09-29: qty 2

## 2016-09-29 MED ORDER — FENTANYL CITRATE (PF) 250 MCG/5ML IJ SOLN
INTRAMUSCULAR | Status: AC
  Filled 2016-09-29: qty 5

## 2016-09-29 MED ORDER — DEXAMETHASONE SODIUM PHOSPHATE 10 MG/ML IJ SOLN
INTRAMUSCULAR | Status: DC | PRN
  Administered 2016-09-29: 10 mg via INTRAVENOUS

## 2016-09-29 MED ORDER — SERTRALINE HCL 50 MG PO TABS
100.0000 mg | ORAL_TABLET | Freq: Every day | ORAL | Status: DC
  Administered 2016-09-30: 100 mg via ORAL
  Filled 2016-09-29: qty 2

## 2016-09-29 MED ORDER — ENOXAPARIN SODIUM 40 MG/0.4ML ~~LOC~~ SOLN
40.0000 mg | SUBCUTANEOUS | Status: DC
  Administered 2016-09-30: 40 mg via SUBCUTANEOUS
  Filled 2016-09-29: qty 0.4

## 2016-09-29 MED ORDER — GLYCOPYRROLATE 0.2 MG/ML IV SOSY
PREFILLED_SYRINGE | INTRAVENOUS | Status: DC | PRN
  Administered 2016-09-29: .2 mg via INTRAVENOUS

## 2016-09-29 MED ORDER — IBUPROFEN 800 MG PO TABS: 800.0000 mg | ORAL_TABLET | Freq: Three times a day (TID) | ORAL | Status: DC | PRN

## 2016-09-29 SURGICAL SUPPLY — 56 items
APPLICATOR SURGIFLO ENDO (HEMOSTASIS) IMPLANT
BAG LAPAROSCOPIC 12 15 PORT 16 (BASKET) IMPLANT
BAG RETRIEVAL 12/15 (BASKET)
CHLORAPREP W/TINT 26ML (MISCELLANEOUS) ×2 IMPLANT
COVER SURGICAL LIGHT HANDLE (MISCELLANEOUS) ×2 IMPLANT
COVER TIP SHEARS 8 DVNC (MISCELLANEOUS) ×1 IMPLANT
COVER TIP SHEARS 8MM DA VINCI (MISCELLANEOUS) ×1
DERMABOND ADVANCED (GAUZE/BANDAGES/DRESSINGS) ×1
DERMABOND ADVANCED .7 DNX12 (GAUZE/BANDAGES/DRESSINGS) ×1 IMPLANT
DRAPE ARM DVNC X/XI (DISPOSABLE) ×4 IMPLANT
DRAPE COLUMN DVNC XI (DISPOSABLE) ×1 IMPLANT
DRAPE DA VINCI XI ARM (DISPOSABLE) ×4
DRAPE DA VINCI XI COLUMN (DISPOSABLE) ×1
DRAPE SHEET LG 3/4 BI-LAMINATE (DRAPES) ×4 IMPLANT
DRAPE SURG IRRIG POUCH 19X23 (DRAPES) ×2 IMPLANT
ELECT REM PT RETURN 15FT ADLT (MISCELLANEOUS) ×2 IMPLANT
GLOVE BIO SURGEON STRL SZ 6 (GLOVE) ×8 IMPLANT
GLOVE BIO SURGEON STRL SZ 6.5 (GLOVE) ×4 IMPLANT
GOWN STRL REUS W/ TWL LRG LVL3 (GOWN DISPOSABLE) ×2 IMPLANT
GOWN STRL REUS W/TWL LRG LVL3 (GOWN DISPOSABLE) ×2
HOLDER FOLEY CATH W/STRAP (MISCELLANEOUS) ×2 IMPLANT
IRRIG SUCT STRYKERFLOW 2 WTIP (MISCELLANEOUS) ×2
IRRIGATION SUCT STRKRFLW 2 WTP (MISCELLANEOUS) ×1 IMPLANT
KIT BASIN OR (CUSTOM PROCEDURE TRAY) ×2 IMPLANT
KIT PROCEDURE DA VINCI SI (MISCELLANEOUS)
KIT PROCEDURE DVNC SI (MISCELLANEOUS) IMPLANT
MANIPULATOR UTERINE 4.5 ZUMI (MISCELLANEOUS) ×2 IMPLANT
MARKER SKIN DUAL TIP RULER LAB (MISCELLANEOUS) ×2 IMPLANT
NDL SAFETY ECLIPSE 18X1.5 (NEEDLE) ×1 IMPLANT
NEEDLE HYPO 18GX1.5 SHARP (NEEDLE) ×1
NEEDLE SPNL 18GX3.5 QUINCKE PK (NEEDLE) ×2 IMPLANT
OBTURATOR XI 8MM BLADELESS (TROCAR) ×2 IMPLANT
OCCLUDER COLPOPNEUMO (BALLOONS) ×2 IMPLANT
PAD POSITIONING PINK XL (MISCELLANEOUS) ×2 IMPLANT
PORT ACCESS TROCAR AIRSEAL 12 (TROCAR) ×1 IMPLANT
PORT ACCESS TROCAR AIRSEAL 5M (TROCAR) ×1
POUCH SPECIMEN RETRIEVAL 10MM (ENDOMECHANICALS) IMPLANT
SEAL CANN UNIV 5-8 DVNC XI (MISCELLANEOUS) ×4 IMPLANT
SEAL XI 5MM-8MM UNIVERSAL (MISCELLANEOUS) ×4
SET TRI-LUMEN FLTR TB AIRSEAL (TUBING) ×2 IMPLANT
SHEET LAVH (DRAPES) ×2 IMPLANT
SOLUTION ELECTROLUBE (MISCELLANEOUS) ×2 IMPLANT
SURGIFLO W/THROMBIN 8M KIT (HEMOSTASIS) IMPLANT
SUT MNCRL AB 4-0 PS2 18 (SUTURE) ×4 IMPLANT
SUT VIC AB 0 CT1 27 (SUTURE) ×1
SUT VIC AB 0 CT1 27XBRD ANTBC (SUTURE) ×1 IMPLANT
SYR 50ML LL SCALE MARK (SYRINGE) ×2 IMPLANT
SYRINGE 10CC LL (SYRINGE) ×2 IMPLANT
TOWEL OR 17X26 10 PK STRL BLUE (TOWEL DISPOSABLE) ×2 IMPLANT
TOWEL OR NON WOVEN STRL DISP B (DISPOSABLE) ×2 IMPLANT
TRAP SPECIMEN MUCOUS 40CC (MISCELLANEOUS) IMPLANT
TRAY LAPAROSCOPIC (CUSTOM PROCEDURE TRAY) ×2 IMPLANT
TROCAR BLADELESS OPT 5 100 (ENDOMECHANICALS) ×2 IMPLANT
UNDERPAD 30X30 (UNDERPADS AND DIAPERS) ×2 IMPLANT
UNDERPAD 30X30 INCONTINENT (UNDERPADS AND DIAPERS) ×2 IMPLANT
WATER STERILE IRR 1500ML POUR (IV SOLUTION) IMPLANT

## 2016-09-29 NOTE — H&P (View-Only) (Signed)
Consult Note: Gyn-Onc  Consult was requested by Dr. Rutha Bouchard for the evaluation of Stephanie Mclean 55 y.o. female  CC:  Chief Complaint  Patient presents with  . Endometrial cancer    New Consultation    Assessment/Plan:  Ms. Stephanie Mclean  is a 7 y.o.  year old with grade 1 endometrioid endometrial cancer. She has a personal history of an early stage colon cancer removed colonoscopically earlier this year.   A detailed discussion was held with the patient and her family with regard to to her endometrial cancer diagnosis. We discussed the standard management options for uterine cancer which includes surgery followed possibly by adjuvant therapy depending on the results of surgery. The options for surgical management include a hysterectomy and removal of the tubes and ovaries possibly with removal of pelvic and para-aortic lymph nodes. A minimally invasive approach including a robotic hysterectomy or laparoscopic hysterectomy have benefits including shorter hospital stay, recovery time and better wound healing. The alternative approach is an open hysterectomy. The patient has been counseled about these surgical options and the risks of surgery in general including infection, bleeding, damage to surrounding structures (including bowel, bladder, ureters, nerves or vessels), and the postoperative risks of PE/ DVT, and lymphedema. I extensively reviewed the additional risks of robotic hysterectomy including possible need for conversion to open laparotomy.  I discussed positioning during surgery of trendelenberg and risks of minor facial swelling and care we take in preoperative positioning.  After counseling and consideration of her options, she desires to proceed with robotic asssisted total hysterectomy, BSO, SLN biopsy.  I discussed that she should attempt to cut back or quit smoking preoperatively to decrease risk of perioperative complications and optimize wound healing.  She will be seen by  anesthesia for preoperative clearance and discussion of postoperative pain management.  She was given the opportunity to ask questions, which were answered to her satisfaction, and she is agreement with the above mentioned plan of care.  She will be referred to genetic counselors to discuss Lynch syndrome testing given her young age at diagnosis (<60), lack of comorbidities, and metachronous colon cancer diagnosis. She otherwise does not have a family history suggestive of lynch syndrome   HPI: Stephanie Mclean is a 36 year old G2P2 who is seen in consultation at the request of Dr Rutha Bouchard for endometrial cancer (grade 1).  The patient has a history of abnormal uterine bleeding for approximately 5 years. She reported having benign endometrial sampling in approximately 2014. She has known uterine fibroids and an ultrasound of the pelvis in December 2016 showed the largest measuring 2.2cm, and an 43mm endometrial thickness.   Endometrial sampling with pipelle on 08/16/16 showed FIGO grade 1 endometrial adenocarcinoma.   In January 2017 she was diagnosed with a preinvasive vs early stage colon cancer that was completely resected colonoscopically.  She has no first or second degree relatives with Lynch-associated malignancies.  Current Meds:  Outpatient Encounter Prescriptions as of 08/31/2016  Medication Sig  . ezetimibe (ZETIA) 10 MG tablet Take 10 mg by mouth daily.  . Fe Fum-FA-B Cmp-C-Zn-Mg-Mn-Cu (FERROCITE PLUS) 106-1 MG TABS Take 1 tablet by mouth 2 (two) times daily.  Marland Kitchen gemfibrozil (LOPID) 600 MG tablet Take 600 mg by mouth 2 (two) times daily before a meal.  . LORazepam (ATIVAN) 0.5 MG tablet Take 0.5 mg by mouth every 8 (eight) hours as needed for anxiety.  Marland Kitchen omeprazole (PRILOSEC) 40 MG capsule Take 40 mg by mouth daily.  . sertraline (  ZOLOFT) 100 MG tablet Take 100 mg by mouth daily.   No facility-administered encounter medications on file as of 08/31/2016.     Allergy: Not on  File  Social Hx:   Social History   Social History  . Marital status: Married    Spouse name: N/A  . Number of children: N/A  . Years of education: N/A   Occupational History  . Not on file.   Social History Main Topics  . Smoking status: Current Some Day Smoker    Packs/day: 0.50    Years: 30.00    Types: Cigarettes  . Smokeless tobacco: Never Used  . Alcohol use Yes     Comment: occ  . Drug use: No  . Sexual activity: Yes   Other Topics Concern  . Not on file   Social History Narrative  . No narrative on file    Past Surgical Hx:  Past Surgical History:  Procedure Laterality Date  . TONSILLECTOMY     at age 40 years old    Past Medical Hx:  Past Medical History:  Diagnosis Date  . Anemia   . Colon cancer ([place]) 12/2015  . Depression     Past Gynecological History:  Abnormal uterine bleeding, SVD x 2 No LMP recorded.  Family Hx:  Family History  Problem Relation Age of Onset  . Diabetes Mother   . Stroke Father   . Cancer Paternal Uncle     Review of Systems:  Constitutional  Feels well,    ENT Normal appearing ears and nares bilaterally Skin/Breast  No rash, sores, jaundice, itching, dryness Cardiovascular  No chest pain, shortness of breath, or edema  Pulmonary  No cough or wheeze.  Gastro Intestinal  No nausea, vomitting, or diarrhoea. No bright red blood per rectum, no abdominal pain, change in bowel movement, or constipation.  Genito Urinary  No frequency, urgency, dysuria, + abnormal bleeding Musculo Skeletal  No myalgia, arthralgia, joint swelling or pain  Neurologic  No weakness, numbness, change in gait,  Psychology  No depression, anxiety, insomnia.   Vitals:  Blood pressure (!) 114/93, pulse 73, temperature 98.2 F (36.8 C), temperature source Oral, resp. rate 18, height 5\' 2"  (1.575 m), weight 147 lb 11.2 oz (67 kg), SpO2 96 %.  Physical Exam: WD in NAD Neck  Supple NROM, without any enlargements.  Lymph Node  Survey No cervical supraclavicular or inguinal adenopathy Cardiovascular  Pulse normal rate, regularity and rhythm. S1 and S2 normal.  Lungs  Clear to auscultation bilateraly, without wheezes/crackles/rhonchi. Good air movement.  Skin  No rash/lesions/breakdown  Psychiatry  Alert and oriented to person, place, and time  Abdomen  Normoactive bowel sounds, abdomen soft, non-tender and nonobese without evidence of hernia.  Back No CVA tenderness Genito Urinary  Vulva/vagina: Normal external female genitalia.  No lesions. No discharge or bleeding.  Bladder/urethra:  No lesions or masses, well supported bladder  Vagina: normal  Cervix: Normal appearing, no lesions.  Uterus:  Slightly bulky mobile, no parametrial involvement or nodularity.  Adnexa: nor masses. Rectal  Deferred.  Extremities  No bilateral cyanosis, clubbing or edema.   Donaciano Eva, MD  08/31/2016, 10:59 AM

## 2016-09-29 NOTE — Interval H&P Note (Signed)
History and Physical Interval Note:  09/29/2016 7:05 AM  Stephanie Mclean  has presented today for surgery, with the diagnosis of ENDOMETRIAL CANCER  The various methods of treatment have been discussed with the patient and family. After consideration of risks, benefits and other options for treatment, the patient has consented to  Procedure(s): XI ROBOTIC ASSISTED TOTAL HYSTERECTOMY WITH BILATERAL SALPINGO OOPHORECTOMY (N/A) SENTINAL LYMPH NODE BIOPSY (N/A) as a surgical intervention .  The patient's history has been reviewed, patient examined, no change in status, stable for surgery.  I have reviewed the patient's chart and labs.  Questions were answered to the patient's satisfaction.     Donaciano Eva

## 2016-09-29 NOTE — Anesthesia Postprocedure Evaluation (Signed)
Anesthesia Post Note  Patient: Stephanie Mclean  Procedure(s) Performed: Procedure(s) (LRB): XI ROBOTIC ASSISTED TOTAL HYSTERECTOMY WITH BILATERAL SALPINGO OOPHORECTOMY (N/A) SENTINAL LYMPH NODE BIOPSY (N/A)  Patient location during evaluation: PACU Anesthesia Type: General Level of consciousness: awake and alert Pain management: pain level controlled Vital Signs Assessment: post-procedure vital signs reviewed and stable Respiratory status: spontaneous breathing, nonlabored ventilation, respiratory function stable and patient connected to nasal cannula oxygen Cardiovascular status: blood pressure returned to baseline and stable Postop Assessment: no signs of nausea or vomiting Anesthetic complications: no    Last Vitals:  Vitals:   09/29/16 1015 09/29/16 1030  BP: 106/71 109/68  Pulse: 85 95  Resp: 13 18  Temp:      Last Pain:  Vitals:   09/29/16 1030  TempSrc:   PainSc: 7                  Avanish Cerullo S

## 2016-09-29 NOTE — Transfer of Care (Signed)
Immediate Anesthesia Transfer of Care Note  Patient: Stephanie Mclean  Procedure(s) Performed: Procedure(s): XI ROBOTIC ASSISTED TOTAL HYSTERECTOMY WITH BILATERAL SALPINGO OOPHORECTOMY (N/A) SENTINAL LYMPH NODE BIOPSY (N/A)  Patient Location: PACU  Anesthesia Type:General  Level of Consciousness:  sedated, patient cooperative and responds to stimulation  Airway & Oxygen Therapy:Patient Spontanous Breathing and Patient connected to face mask oxgen  Post-op Assessment:  Report given to PACU RN and Post -op Vital signs reviewed and stable  Post vital signs:  Reviewed and stable  Last Vitals:  Vitals:   09/29/16 0533  BP: (!) 125/91  Pulse: 95  Resp: 16  Temp: [id number] C    Complications: No apparent anesthesia complications

## 2016-09-29 NOTE — Anesthesia Procedure Notes (Signed)
Procedure Name: Intubation Date/Time: 09/29/2016 7:56 AM Performed by: Karina Lenderman, Virgel Gess Pre-anesthesia Checklist: Patient identified, Emergency Drugs available, Suction available, Patient being monitored and Timeout performed Patient Re-evaluated:Patient Re-evaluated prior to inductionOxygen Delivery Method: Circle system utilized Preoxygenation: Pre-oxygenation with 100% oxygen Intubation Type: IV induction Ventilation: Mask ventilation without difficulty Laryngoscope Size: Mac and 3 Grade View: Grade II Tube type: Oral Tube size: 7.0 mm Number of attempts: 1 Airway Equipment and Method: Stylet Placement Confirmation: ETT inserted through vocal cords under direct vision,  positive ETCO2,  CO2 detector and breath sounds checked- equal and bilateral Secured at: 21 cm Tube secured with: Tape Dental Injury: Teeth and Oropharynx as per pre-operative assessment

## 2016-09-29 NOTE — Anesthesia Preprocedure Evaluation (Signed)
Anesthesia Evaluation  Patient identified by MRN, date of birth, ID band Patient awake    Reviewed: Allergy & Precautions, NPO status , Patient's Chart, lab work & pertinent test results  Airway Mallampati: II  TM Distance: >3 FB Neck ROM: Full    Dental no notable dental hx.    Pulmonary neg pulmonary ROS, Current Smoker,    Pulmonary exam normal breath sounds clear to auscultation       Cardiovascular negative cardio ROS Normal cardiovascular exam Rhythm:Regular Rate:Normal     Neuro/Psych negative neurological ROS  negative psych ROS   GI/Hepatic negative GI ROS, Neg liver ROS,   Endo/Other  negative endocrine ROS  Renal/GU negative Renal ROS  negative genitourinary   Musculoskeletal negative musculoskeletal ROS (+)   Abdominal   Peds negative pediatric ROS (+)  Hematology negative hematology ROS (+)   Anesthesia Other Findings   Reproductive/Obstetrics negative OB ROS                             Anesthesia Physical Anesthesia Plan  ASA: II  Anesthesia Plan: General   Post-op Pain Management:    Induction: Intravenous  Airway Management Planned: Oral ETT  Additional Equipment:   Intra-op Plan:   Post-operative Plan: Extubation in OR  Informed Consent: I have reviewed the patients History and Physical, chart, labs and discussed the procedure including the risks, benefits and alternatives for the proposed anesthesia with the patient or authorized representative who has indicated his/her understanding and acceptance.   Dental advisory given  Plan Discussed with: CRNA and Surgeon  Anesthesia Plan Comments:         Anesthesia Quick Evaluation

## 2016-09-29 NOTE — Op Note (Signed)
OPERATIVE NOTE 09/29/16  Surgeon: Donaciano Eva   Assistants: Dr Lahoma Crocker (an MD assistant was necessary for tissue manipulation, management of robotic instrumentation, retraction and positioning due to the complexity of the case and hospital policies).   Anesthesia: General endotracheal anesthesia  ASA Class: 3   Pre-operative Diagnosis: grade 1 endometrial cancer  Post-operative Diagnosis: same  Operation: Robotic-assisted laparoscopic total hysterectomy with bilateral salpingoophorectomy, SLN biopsy  Surgeon: Donaciano Eva  Assistant Surgeon: Lahoma Crocker MD  Anesthesia: GET  Urine Output: 200cc  Operative Findings:  : 8cm uterus, no gross extrauterine disease, bilateral mapping  Estimated Blood Loss:  <20      Total IV Fluids: 700 ml         Specimens: right external iiliac SLN, right internal iliac SLN, left external iliac SLN, left presacral SLN 1 & 2; uterus , cervix bilateral tube and ovaries         Complications:  None; patient tolerated the procedure well.         Disposition: PACU - hemodynamically stable.  Procedure Details  The patient was seen in the Holding Room. The risks, benefits, complications, treatment options, and expected outcomes were discussed with the patient.  The patient concurred with the proposed plan, giving informed consent.  The site of surgery properly noted/marked. The patient was identified as Stephanie Mclean and the procedure verified as a Robotic-assisted hysterectomy with bilateral salpingo oophorectomy and SLN biopsy. A Time Out was held and the above information confirmed.  After induction of anesthesia, the patient was draped and prepped in the usual sterile manner. Pt was placed in supine position after anesthesia and draped and prepped in the usual sterile manner. The abdominal drape was placed after the CholoraPrep had been allowed to dry for 3 minutes.  Her arms were tucked to her side with all  appropriate precautions.  The shoulders were stabilized with padded shoulder blocks applied to the acromium processes.  The patient was placed in the semi-lithotomy position in [place].  The perineum was prepped with Betadine. The patient was then prepped. Foley catheter was placed.  A sterile speculum was placed in the vagina.  The cervix was grasped with a single-tooth tenaculum and dilated with Kennon Rounds dilators. 1mg  total of ICG was injected into the cervical stroma at 2 and 9 o'clock at a 70mm depth (concentration 0..5mg /ml).  The ZUMI uterine manipulator with a medium colpotomizer ring was placed without difficulty.  A pneum occluder balloon was placed over the manipulator.  OG tube placement was confirmed and to suction.   Next, a 5 mm skin incision was made 1 cm below the subcostal margin in the midclavicular line.  The 5 mm Optiview port and scope was used for direct entry.  Opening pressure was under 10 mm CO2.  The abdomen was insufflated and the findings were noted as above.   At this point and all points during the procedure, the patient's intra-abdominal pressure did not exceed 15 mmHg. Next, a 10 mm skin incision was made in the umbilicus and a right and left port was placed about 10 cm lateral to the robot port on the right and left side.  A fourth arm was placed in the left lower quadrant 2 cm above and superior and medial to the anterior superior iliac spine.  All ports were placed under direct visualization.  The patient was placed in steep Trendelenburg.  Bowel was folded away into the upper abdomen.  The robot was docked  in the normal manner.  The right and left peritoneum were opened parallel to the IP ligament to open the retroperitoneal spaces bilaterally. The SLN mapping was performed in bilateral pelvic basins. The para rectal and paravesical spaces were opened up. Lymphatic channels were identified travelling to the following visualized sentinel lymph node's: bilateral mapping to  the right external and internal iliac SLN, on the left there was a left external iliac SLN and a cluster of presacral SLN's. The 2nd specimen reflects possible afferent channels vs fist in chain SLN on the presacral node cannel.. These SLN's were separated from their surrounding lymphatic tissue, removed and sent for permanent pathology.  The hysterectomy was started after the round ligament on the right side was incised and the retroperitoneum was entered and the pararectal space was developed.  The ureter was noted to be on the medial leaf of the broad ligament.  The peritoneum above the ureter was incised and stretched and the infundibulopelvic ligament was skeletonized, cauterized and cut.  The posterior peritoneum was taken down to the level of the KOH ring.  The anterior peritoneum was also taken down.  The bladder flap was created to the level of the KOH ring.  The uterine artery on the right side was skeletonized, cauterized and cut in the normal manner.  A similar procedure was performed on the left.  The colpotomy was made and the uterus, cervix, bilateral ovaries and tubes were amputated and delivered through the vagina.  Pedicles were inspected and excellent hemostasis was achieved.    The colpotomy at the vaginal cuff was closed with Vicryl on a CT1 needle in a running manner.  Irrigation was used and excellent hemostasis was achieved.  At this point in the procedure was completed.  Robotic instruments were removed under direct visulaization.  The robot was undocked. The 10 mm ports were closed with Vicryl on a UR-5 needle and the fascia was closed with 0 Vicryl on a UR-5 needle.  The skin was closed with 4-0 Vicryl in a subcuticular manner.  Dermabond was applied.  Sponge, lap and needle counts correct x 2.  The patient was taken to the recovery room in stable condition.  The vagina was swabbed with  minimal bleeding noted.   All instrument and needle counts were correct x  3.   The patient was  transferred to the recovery room in stable condition.  Donaciano Eva, MD

## 2016-09-30 DIAGNOSIS — C541 Malignant neoplasm of endometrium: Secondary | ICD-10-CM | POA: Diagnosis not present

## 2016-09-30 LAB — CBC
HCT: 35.3 % — ABNORMAL LOW (ref 36.0–46.0)
Hemoglobin: 12.3 g/dL (ref 12.0–15.0)
MCH: 32.1 pg (ref 26.0–34.0)
MCHC: 34.8 g/dL (ref 30.0–36.0)
MCV: 92.2 fL (ref 78.0–100.0)
PLATELETS: 218 10*3/uL (ref 150–400)
RBC: 3.83 MIL/uL — ABNORMAL LOW (ref 3.87–5.11)
RDW: 12.2 % (ref 11.5–15.5)
WBC: 5.7 10*3/uL (ref 4.0–10.5)

## 2016-09-30 LAB — BASIC METABOLIC PANEL
ANION GAP: 5 (ref 5–15)
BUN: 17 mg/dL (ref 6–20)
CALCIUM: 8.8 mg/dL — AB (ref 8.9–10.3)
CO2: 28 mmol/L (ref 22–32)
Chloride: 106 mmol/L (ref 101–111)
Creatinine, Ser: 0.55 mg/dL (ref 0.44–1.00)
GFR calc Af Amer: >60 mL/min (ref >60)
GLUCOSE: 105 mg/dL — AB (ref 65–99)
Potassium: 3.9 mmol/L (ref 3.5–5.1)
SODIUM: 139 mmol/L (ref 135–145)

## 2016-09-30 MED ORDER — OXYCODONE-ACETAMINOPHEN 5-325 MG PO TABS: 1.0000 | ORAL_TABLET | ORAL | 0 refills | Status: DC | PRN

## 2016-09-30 NOTE — Discharge Instructions (Signed)
09/30/2016  Return to work: 4-6 weeks if applicable  Activity: 1. Be up and out of the bed during the day.  Take a nap if needed.  You may walk up steps but be careful and use the hand rail.  Stair climbing will tire you more than you think, you may need to stop part way and rest.   2. No lifting or straining for 6 weeks.  3. No driving for 1 week(s).  Do not drive if you are taking narcotic pain medicine.  4. Shower daily.  Use soap and water on your incision and pat dry; don't rub.  No tub baths until cleared by your surgeon.   5. No sexual activity and nothing in the vagina for 8 weeks.  6. You may experience a small amount of clear drainage from your incisions, which is normal.  If the drainage persists or increases, please call the office.   Diet: 1. Low sodium Heart Healthy Diet is recommended.  2. It is safe to use a laxative, such as Miralax or Colace, if you have difficulty moving your bowels.   Wound Care: 1. Keep clean and dry.  Shower daily.  Reasons to call the Doctor:  Fever - Oral temperature greater than 100.4 degrees Fahrenheit  Foul-smelling vaginal discharge  Difficulty urinating  Nausea and vomiting  Increased pain at the site of the incision that is unrelieved with pain medicine.  Difficulty breathing with or without chest pain  New calf pain especially if only on one side  Sudden, continuing increased vaginal bleeding with or without clots.   Contacts: For questions or concerns you should contact:  Dr. Everitt Amber at [phone]  Joylene John, NP at [phone]  After Hours: call [phone] and have the GYN Oncologist paged/contacted

## 2016-09-30 NOTE — Discharge Summary (Signed)
Physician Discharge Summary  Patient ID: Stephanie Mclean MRN: [id number] DOB/AGE: [birth year] 55 y.o.  Admit date: 09/29/2016 Discharge date: 09/30/2016  Admission Diagnoses: Endometrial cancer [facility])  Discharge Diagnoses:  Principal Problem:   Endometrial cancer [facility])   Discharged Condition:  The patient is in good condition and stable for discharge.    Hospital Course: On 09/29/2016, the patient underwent the following: Procedure(s): XI ROBOTIC ASSISTED TOTAL HYSTERECTOMY WITH BILATERAL SALPINGO OOPHORECTOMY SENTINEL LYMPH NODE BIOPSY.   The postoperative course was uneventful.  She was discharged to home on postoperative day 1 tolerating a regular diet, voiding, minimal pain, ambulating, passing flatus.  Consults: None  Significant Diagnostic Studies: None  Treatments: surgery: see above  Discharge Exam: Blood pressure 110/67, pulse 75, temperature 98 F (36.7 C), temperature source Oral, resp. rate 16, height 5\' 2"  (1.575 m), weight 146 lb (66.2 kg), last menstrual period 09/12/2016, SpO2 95 %. General appearance: alert, cooperative and no distress Resp: clear to auscultation bilaterally and mild wheezes noted in the upper lobes, cleared with a cough Cardio: regular rate and rhythm, S1, S2 normal, no murmur, click, rub or gallop GI: soft, non-tender; bowel sounds normal; no masses,  no organomegaly Extremities: extremities normal, atraumatic, no cyanosis or edema Incision/Wound: lap sites to the abdomen with dermabond without active bleeding or drainage  Disposition: Home  Discharge Instructions    Call MD for:  difficulty breathing, headache or visual disturbances    Complete by:  As directed    Call MD for:  extreme fatigue    Complete by:  As directed    Call MD for:  hives    Complete by:  As directed    Call MD for:  persistant dizziness or light-headedness    Complete by:  As directed    Call MD for:  persistant nausea and vomiting    Complete by:  As directed     Call MD for:  redness, tenderness, or signs of infection (pain, swelling, redness, odor or green/yellow discharge around incision site)    Complete by:  As directed    Call MD for:  severe uncontrolled pain    Complete by:  As directed    Call MD for:  temperature >100.4    Complete by:  As directed    Diet - low sodium heart healthy    Complete by:  As directed    Driving Restrictions    Complete by:  As directed    No driving for 1 week.  Do not take narcotics and drive.   Increase activity slowly    Complete by:  As directed    Lifting restrictions    Complete by:  As directed    No lifting greater than 10 lbs.   Sexual Activity Restrictions    Complete by:  As directed    No sexual activity, nothing in the vagina, for 8 weeks.       Medication List    TAKE these medications   ezetimibe 10 MG tablet Commonly known as:  ZETIA Take 10 mg by mouth daily.   FERROCITE PLUS 106-1 MG Tabs Take 1 tablet by mouth 2 (two) times daily.   gemfibrozil 600 MG tablet Commonly known as:  LOPID Take 600 mg by mouth 2 (two) times daily before a meal.   ibuprofen 200 MG tablet Commonly known as:  ADVIL,MOTRIN Take 400 mg by mouth every 6 (six) hours as needed (for pain/headache.).   LORazepam 0.5 MG tablet Commonly known as:  ATIVAN Take 0.5 mg by mouth every 8 (eight) hours as needed for anxiety.   omeprazole 40 MG capsule Commonly known as:  PRILOSEC Take 40 mg by mouth as needed.   oxyCODONE-acetaminophen 5-325 MG tablet Commonly known as:  PERCOCET/ROXICET Take 1-2 tablets by mouth every 4 (four) hours as needed (moderate to severe pain).   sertraline 100 MG tablet Commonly known as:  ZOLOFT Take 100 mg by mouth daily.      Follow-up Information    Donaciano Eva, MD Follow up on 10/17/2016.   Specialty:  Obstetrics and Gynecology Why:  at 11:45am at the [facility] information: [place] [id number] [phone]            Greater than thirty minutes were spend for face to face discharge instructions and discharge orders/summary in EPIC.   Signed: CROSS, MELISSA DEAL 09/30/2016, 9:53 AM

## 2016-10-04 ENCOUNTER — Telehealth: Payer: Self-pay | Admitting: Gynecologic Oncology

## 2016-10-04 NOTE — Telephone Encounter (Signed)
Informed patient of findings of stage IA grade 1 endometrioid endometrial adenocarcinoma with low risk features for recurrence. Recommendation in accordance with NCCN guidelines is for close follow-up (no adjuvant therapy recommended).  Patient doing well post-op.  Donaciano Eva, MD

## 2016-10-17 ENCOUNTER — Encounter: Payer: Self-pay | Admitting: Gynecologic Oncology

## 2016-10-17 ENCOUNTER — Ambulatory Visit: Payer: BLUE CROSS/BLUE SHIELD | Attending: Gynecologic Oncology | Admitting: Gynecologic Oncology

## 2016-10-17 VITALS — BP 123/81 | HR 77 | Temp 98.0°F | Resp 17 | Wt 151.0 lb

## 2016-10-17 DIAGNOSIS — Z90710 Acquired absence of both cervix and uterus: Secondary | ICD-10-CM | POA: Insufficient documentation

## 2016-10-17 DIAGNOSIS — Z809 Family history of malignant neoplasm, unspecified: Secondary | ICD-10-CM | POA: Insufficient documentation

## 2016-10-17 DIAGNOSIS — Z833 Family history of diabetes mellitus: Secondary | ICD-10-CM | POA: Diagnosis not present

## 2016-10-17 DIAGNOSIS — J45909 Unspecified asthma, uncomplicated: Secondary | ICD-10-CM | POA: Diagnosis not present

## 2016-10-17 DIAGNOSIS — F329 Major depressive disorder, single episode, unspecified: Secondary | ICD-10-CM | POA: Diagnosis not present

## 2016-10-17 DIAGNOSIS — F17210 Nicotine dependence, cigarettes, uncomplicated: Secondary | ICD-10-CM | POA: Diagnosis not present

## 2016-10-17 DIAGNOSIS — Z823 Family history of stroke: Secondary | ICD-10-CM | POA: Diagnosis not present

## 2016-10-17 DIAGNOSIS — Z85038 Personal history of other malignant neoplasm of large intestine: Secondary | ICD-10-CM

## 2016-10-17 DIAGNOSIS — Z79899 Other long term (current) drug therapy: Secondary | ICD-10-CM | POA: Insufficient documentation

## 2016-10-17 DIAGNOSIS — Z9889 Other specified postprocedural states: Secondary | ICD-10-CM | POA: Insufficient documentation

## 2016-10-17 DIAGNOSIS — F419 Anxiety disorder, unspecified: Secondary | ICD-10-CM | POA: Diagnosis not present

## 2016-10-17 DIAGNOSIS — E785 Hyperlipidemia, unspecified: Secondary | ICD-10-CM | POA: Diagnosis not present

## 2016-10-17 DIAGNOSIS — C541 Malignant neoplasm of endometrium: Secondary | ICD-10-CM

## 2016-10-17 NOTE — Patient Instructions (Signed)
Plan to follow up in May 2018 or sooner if needed.  Please call for any questions or concerns.
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
## 2017-04-13 ENCOUNTER — Telehealth: Payer: Self-pay | Admitting: *Deleted

## 2017-04-13 NOTE — Telephone Encounter (Signed)
Patient called and moved her appt from May 7th to May 14th.

## 2017-04-17 ENCOUNTER — Ambulatory Visit: Payer: BLUE CROSS/BLUE SHIELD | Admitting: Gynecologic Oncology

## 2017-04-24 ENCOUNTER — Other Ambulatory Visit: Payer: Self-pay | Admitting: Gynecologic Oncology

## 2017-04-24 ENCOUNTER — Ambulatory Visit: Payer: BLUE CROSS/BLUE SHIELD | Attending: Gynecologic Oncology | Admitting: Gynecologic Oncology

## 2017-04-24 ENCOUNTER — Encounter: Payer: Self-pay | Admitting: Gynecologic Oncology

## 2017-04-24 VITALS — BP 122/84 | HR 62 | Temp 97.7°F | Resp 18 | Wt 155.8 lb

## 2017-04-24 DIAGNOSIS — C541 Malignant neoplasm of endometrium: Secondary | ICD-10-CM

## 2017-04-24 DIAGNOSIS — R6889 Other general symptoms and signs: Secondary | ICD-10-CM

## 2017-04-24 NOTE — Patient Instructions (Signed)
Please notify Dr Denman George at phone number [phone] if you notice change in vaginal bleeding, new pelvic or abdominal pains, bloating, feeling full easy, or a change in bladder or bowel function.   Contact Dr Evangeline Gula Wright's office at [phone] to schedule a follow-up appointment for November, 2018.  Contact Dr Serita Grit office in early 2019 to schedule a follow-up in May, 2019 (ph: [place]).
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
## 2017-04-24 NOTE — Addendum Note (Signed)
Addended by: Thereasa Solo on: 04/24/2017 02:24 PM   Modules accepted: Orders

## 2017-04-27 ENCOUNTER — Telehealth: Payer: Self-pay

## 2017-04-27 NOTE — Telephone Encounter (Signed)
Told Ms Sann the results of the biopsy as noted below by Dr. Denman George.

## 2017-04-27 NOTE — Telephone Encounter (Signed)
-----   Message from Everitt Amber, MD sent at 04/27/2017  7:15 AM EDT ----- Would you mind letting Stephanie Mclean know that her biopsy of the vagina was benign (no cancer). Thanks Terrence Dupont

## 2017-05-09 ENCOUNTER — Encounter: Payer: Self-pay | Admitting: Genetic Counselor

## 2017-05-09 DIAGNOSIS — Z1379 Encounter for other screening for genetic and chromosomal anomalies: Secondary | ICD-10-CM | POA: Insufficient documentation

## 2017-05-15 ENCOUNTER — Telehealth: Payer: Self-pay

## 2017-05-15 NOTE — Telephone Encounter (Signed)
[facility] referral scheduler for genetics  spoke with Ms  Beane and she refused referral to genetics counseling. This is documented on the referral order dated 04-24-17.

## 2017-12-25 IMAGING — CR DG CHEST 2V
2 series · 2 of 2 positions shown · non-contrast
Comparison: None.

CLINICAL DATA: Preop for endometrial cancer

EXAM:
CHEST  2 VIEW

[w chest pa]
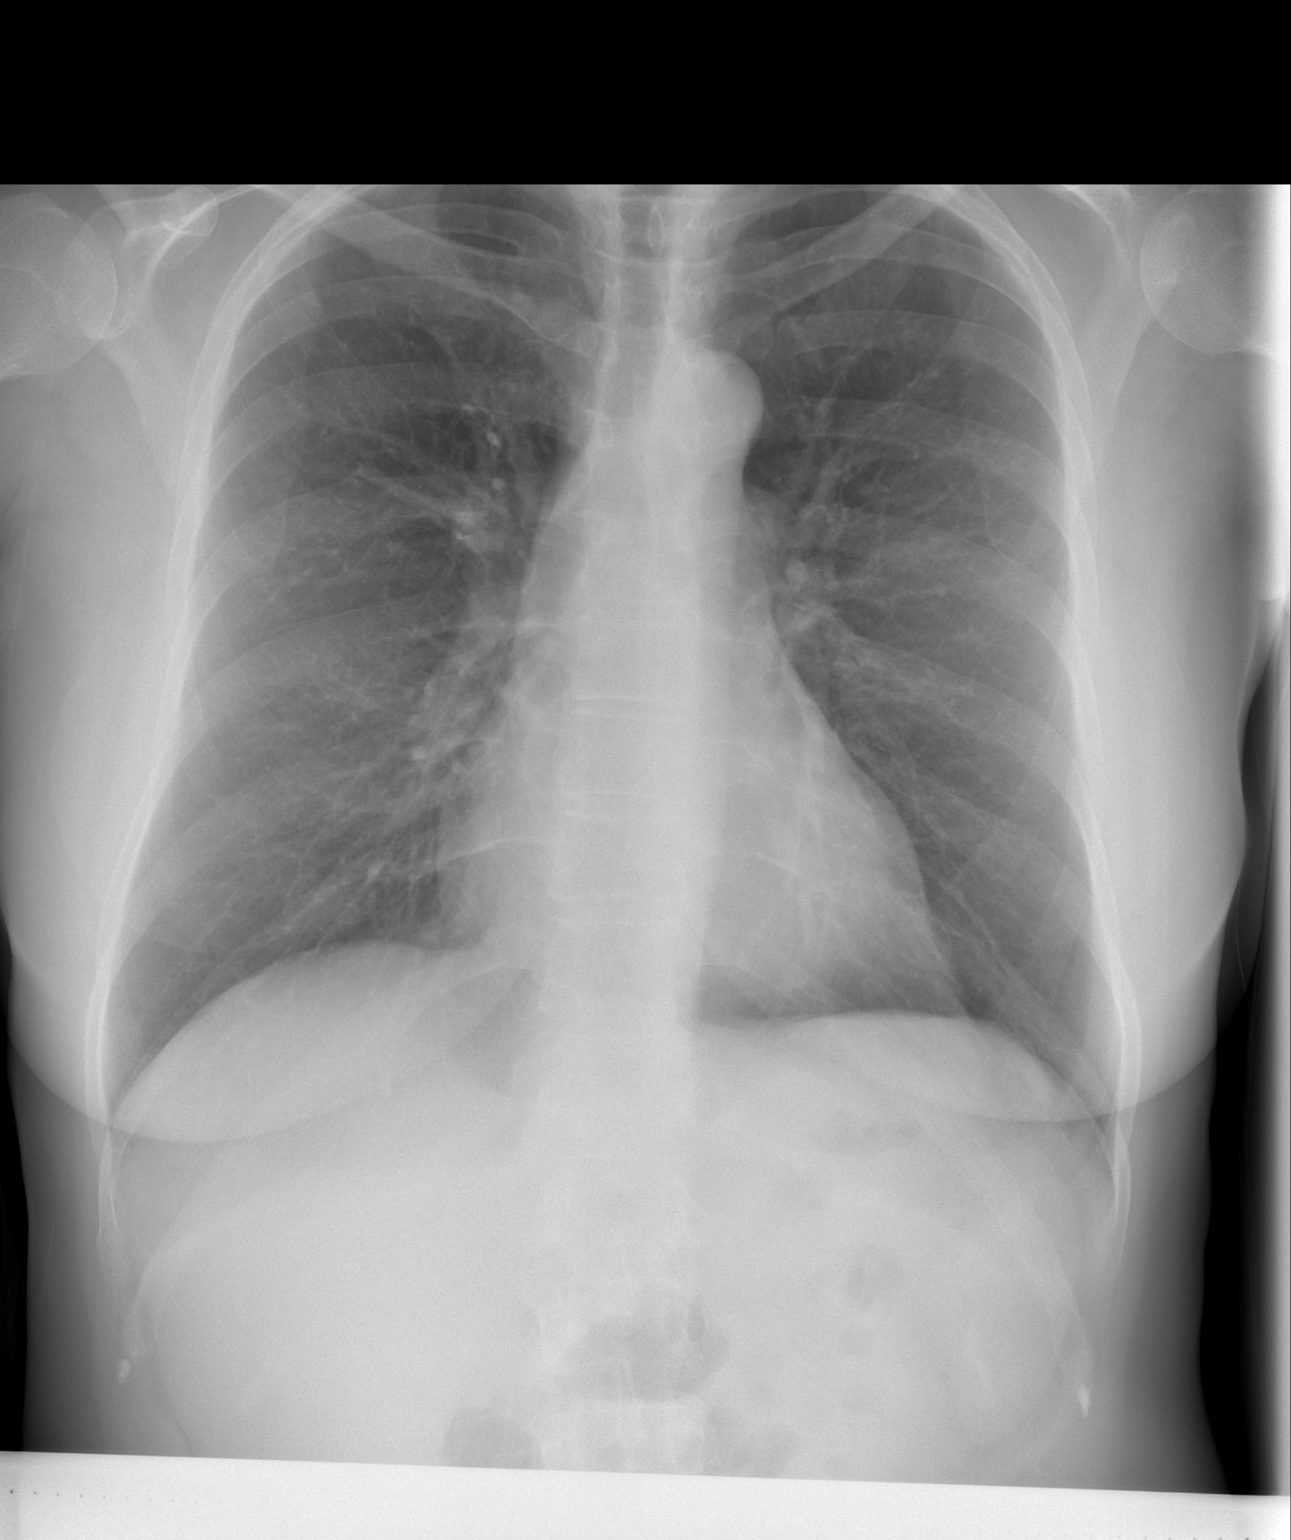

[w chest lat]
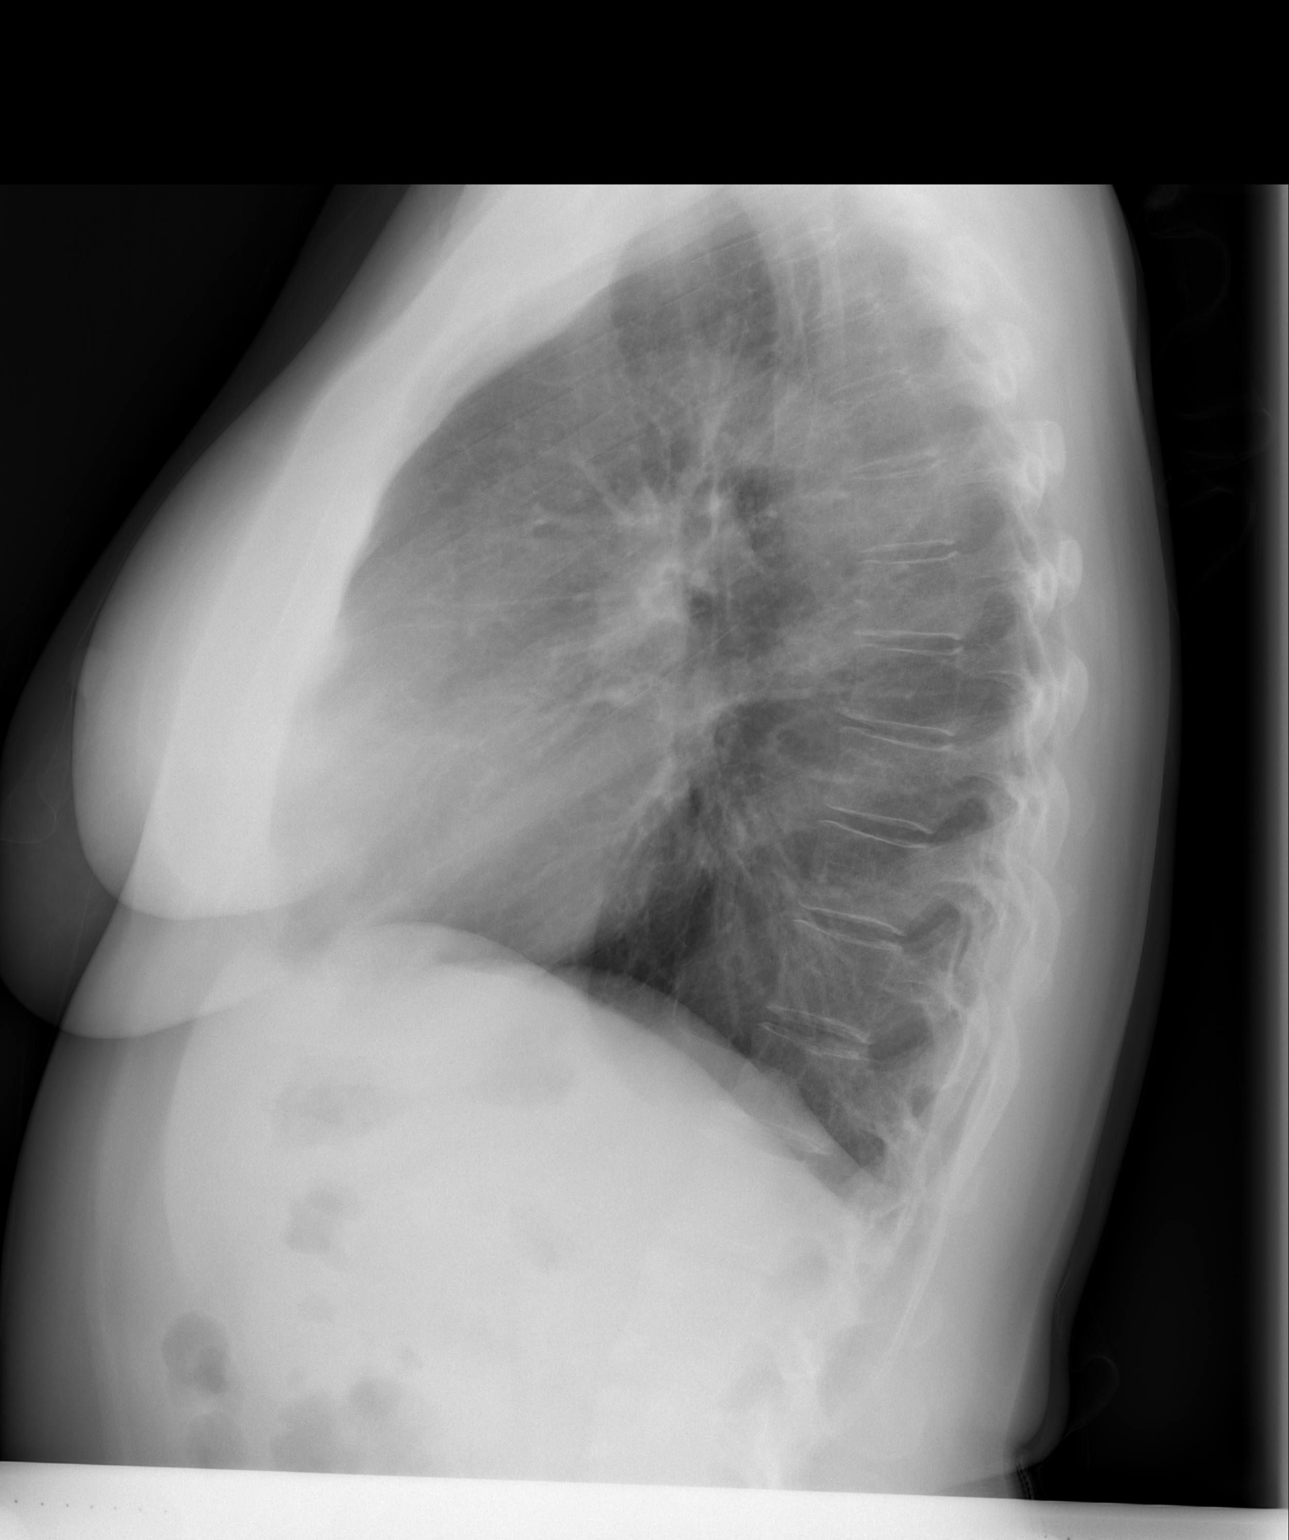

[2 of 2 positions shown; findings below may reference images not displayed]

FINDINGS: The heart size and mediastinal contours are within normal limits.
Both lungs are clear. The visualized skeletal structures are
unremarkable.
IMPRESSION: No active cardiopulmonary disease.
